# Patient Record
Sex: Male | Born: 2005 | Race: Black or African American | Hispanic: No | Marital: Single | Smoking: Never smoker
Health system: Southern US, Community
[De-identification: ages and names within clinical notes are randomized; demographics above are authoritative.]

---
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
## 2024-02-05 ENCOUNTER — Encounter: Payer: Self-pay | Admitting: Family Medicine

## 2024-02-05 ENCOUNTER — Ambulatory Visit (INDEPENDENT_AMBULATORY_CARE_PROVIDER_SITE_OTHER): Payer: Self-pay | Admitting: Family Medicine

## 2024-02-05 VITALS — BP 127/68 | HR 60 | Ht 78.5 in | Wt 173.0 lb

## 2024-02-05 DIAGNOSIS — Z025 Encounter for examination for participation in sport: Secondary | ICD-10-CM

## 2024-02-05 NOTE — Progress Notes (Cosign Needed)
 DATE OF VISIT: 02/05/2024        Christopher Webster DOB: 2005/12/06 MRN: [id number]  CC: Preparticipation sports physical exam fo  History- Christopher Webster is a 18 y.o. male for sports physical exam prior to starting training for track and field at [employer].  Patient recently transferred from [employer] after running track and field there for his freshman and sophomore years.  Patient does not have any significant past medical history and takes no daily medications.  States he completed PPE screening with his athletic trainers on campus and he has no concerns at this time.  Patient denies any injuries requiring surgery and denies any broken bones.  Denies any concussions.  He denies any chest pain or palpitations with exertion.  Denies any history of asthma or shortness of breath that persists outside normal duration.  Denies any history of sickle cell anemia or sickle cell trait.  Patient has no family history of sudden cardiac death and has no personal history of exertional syncope.  No history of arrhythmias.  He has never been told he has a cardiac murmur on evaluation by medical professionals in the past.  Feels that he is fully healthy and ready for all activities related to track and field at [employer].  Up-to-date on vaccinations and will upload pleated vaccination list to Past Medical History History reviewed. No pertinent past medical history.  Past Surgical History History reviewed. No pertinent surgical history.  Medications No current outpatient medications on file.   No current facility-administered medications for this visit.    Allergies has no known allergies.  Family History History reviewed. No pertinent family history.  Social History   reports no history of alcohol use.  reports that he has never smoked. He has never used smokeless tobacco.  reports no history of drug use. OCCUPATION: Engineer, manufacturing systems Level of education/schooling: Currently a junior in  college Exercise: Track and Location manager Diet: Well-balanced diet  EXAM: Vitals: BP 127/68   Pulse 60   Ht 6' 6.5 (1.994 m)   Wt 173 lb (78.5 kg)   BMI 19.74 kg/m  General:  AOx3, NAD, pleasant SKIN:  rashes or lesions, skin clean, dry, intact HEENT:   PERRLA, EOMI, MM , TMs , nares patent,  turbinates, pharynx  NECK:  supple,  adenopathy,  thyromegally HEART:  RRR,  no murmurs, rubs, gallops seated or standing LUNGS:   bilaterally,  wheeze, rhonchi, rales ABD:  soft, NT, ND, ()BS x 4 EXT:   lower ext edema NEURO:  CNII-XII  intact,  focal defecits MSK:   upper ext and lower ext strength bilaterally; able to perform both double leg and single with squats without difficulty. PSYCH:  normal mood   ASSESSMENT: 1.  Routine sports physical exam with no concerns after evaluation  PLAN: 1.  Cleared for participation in all athletic competition.  Patient has no concerning risk factors or personal history that suggest high risk for cardiac event.  Signed all required documents and will fax required documents back to [employer].  He will submit records of his own immunizations to his college. . The patient will return to see me as needed, will call sooner with any questions/concerns.   . Patient expressed understanding & agreement with above.  Encounter Diagnosis  Name Primary?   Routine sports physical exam Yes    No orders of the defined types were placed in this encounter.   No orders of the defined types were placed in  this encounter.

## 2024-02-06 ENCOUNTER — Encounter: Payer: Self-pay | Admitting: Family Medicine
